# Patient Record
Sex: Male | Born: 1970 | Race: Black or African American | Hispanic: No | Marital: Single | Smoking: Former smoker
Health system: Southern US, Community
[De-identification: ages and names within clinical notes are randomized; demographics above are authoritative.]

## PROBLEM LIST (undated history)

## (undated) DIAGNOSIS — K222 Esophageal obstruction: Secondary | ICD-10-CM

## (undated) DIAGNOSIS — Q828 Other specified congenital malformations of skin: Secondary | ICD-10-CM

## (undated) DIAGNOSIS — T884XXA Failed or difficult intubation, initial encounter: Secondary | ICD-10-CM

## (undated) DIAGNOSIS — Z87442 Personal history of urinary calculi: Secondary | ICD-10-CM

## (undated) DIAGNOSIS — T8859XA Other complications of anesthesia, initial encounter: Secondary | ICD-10-CM

## (undated) DIAGNOSIS — T4145XA Adverse effect of unspecified anesthetic, initial encounter: Secondary | ICD-10-CM

## (undated) HISTORY — DX: Personal history of urinary calculi: Z87.442

## (undated) HISTORY — PX: WISDOM TOOTH EXTRACTION: SHX21

## (undated) HISTORY — DX: Other specified congenital malformations of skin: Q82.8

---

## 2008-01-16 ENCOUNTER — Emergency Department: Payer: Self-pay | Admitting: Emergency Medicine

## 2008-01-25 ENCOUNTER — Ambulatory Visit: Payer: Self-pay | Admitting: Urology

## 2008-07-02 IMAGING — CR DG ABDOMEN 1V
1 series · 1 of 1 positions shown · non-contrast
Comparison: none

REASON FOR EXAM: Renal calculi-lithotripsy
COMMENTS:

[view not recorded]
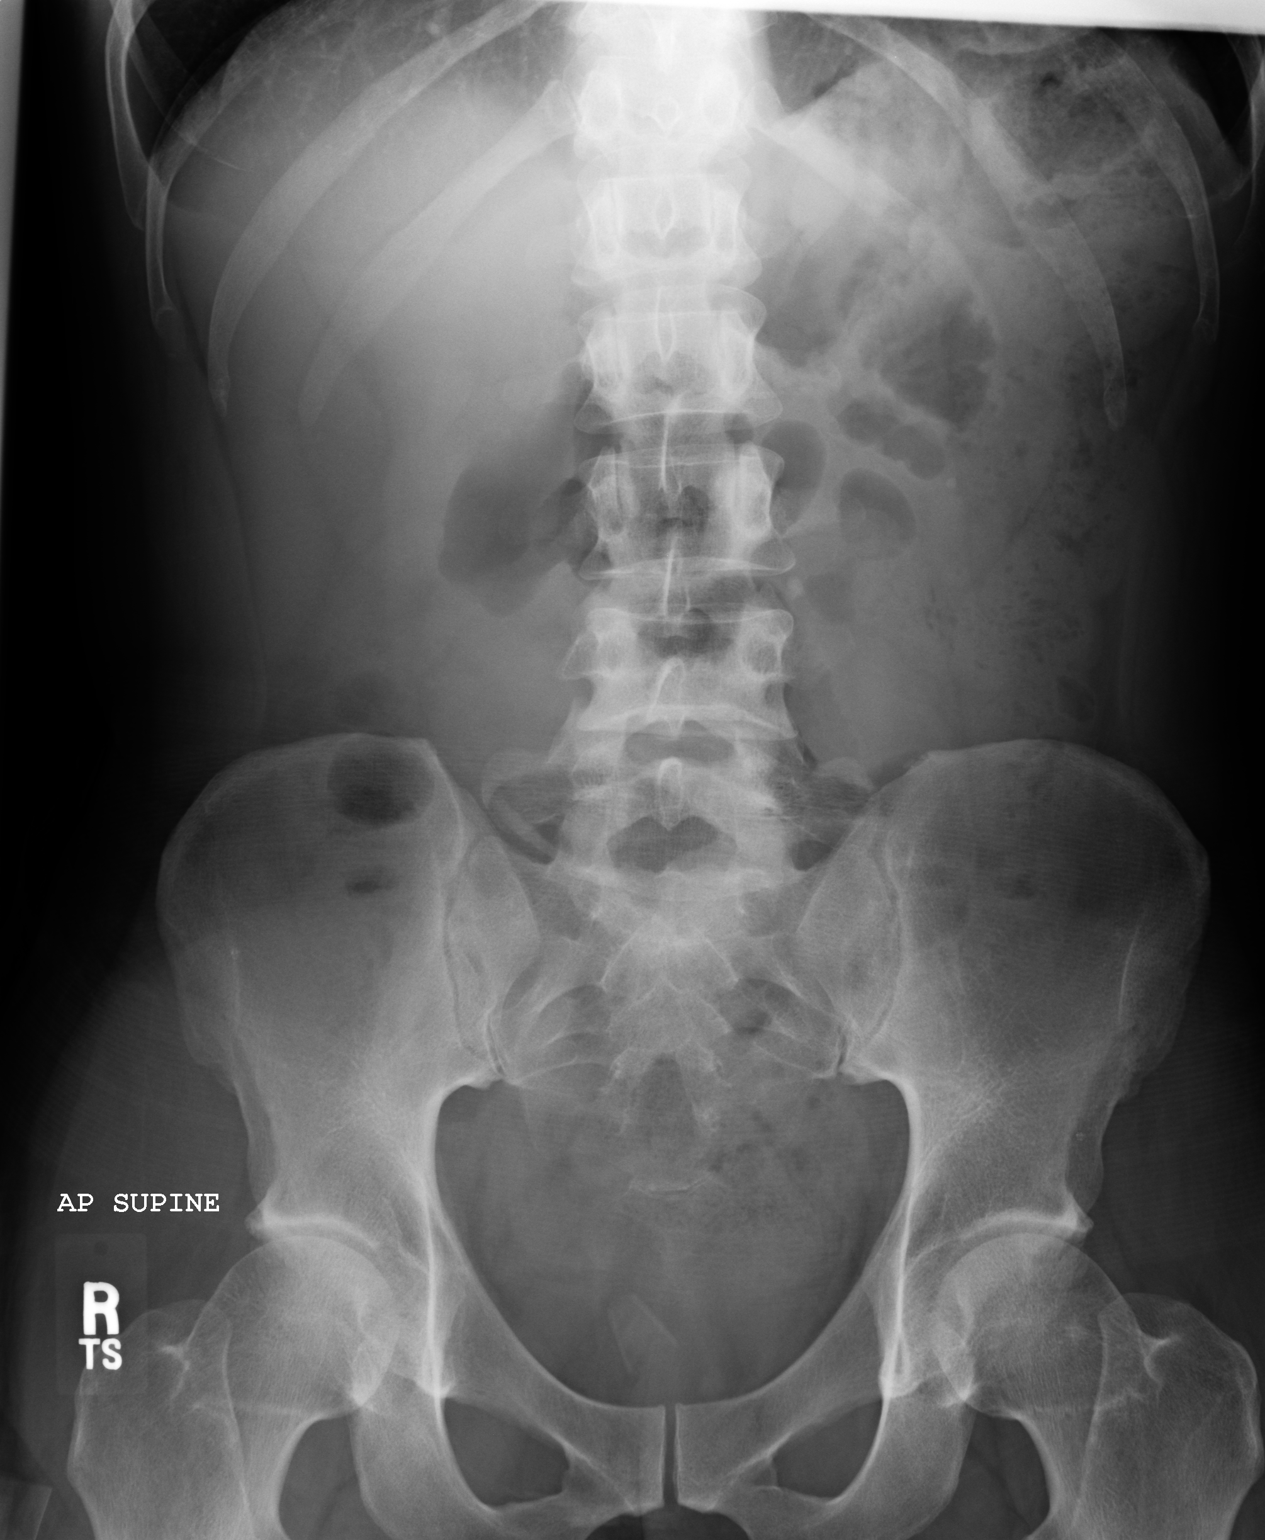

[1 of 1 positions shown; findings below may reference images not displayed]

PROCEDURE:     DXR - DXR KIDNEY URETER BLADDER  - January 25, 2008  [DATE]

RESULT:     There is 5.0 mm calcification projected just lateral to the L3-4
lumbar intervertebral disc space which likely represents the LEFT ureteral
stone noted at prior CT. Also noted is a tiny density projected over the
lower pole of the LEFT kidney compatible with a LEFT renal stone. No renal
or ureteral calcifications are identified on RIGHT.
IMPRESSION: Please see above.

## 2014-05-05 ENCOUNTER — Emergency Department: Payer: Self-pay | Admitting: Emergency Medicine

## 2014-06-05 LAB — LIPID PANEL
CHOLESTEROL: 157 mg/dL (ref 0–200)
HDL: 34 mg/dL — AB (ref 35–70)
LDL Cholesterol: 103 mg/dL
Triglycerides: 99 mg/dL (ref 40–160)

## 2015-06-09 ENCOUNTER — Encounter: Payer: Self-pay | Admitting: Family Medicine

## 2015-06-09 ENCOUNTER — Ambulatory Visit (INDEPENDENT_AMBULATORY_CARE_PROVIDER_SITE_OTHER): Payer: BLUE CROSS/BLUE SHIELD | Admitting: Family Medicine

## 2015-06-09 ENCOUNTER — Encounter (INDEPENDENT_AMBULATORY_CARE_PROVIDER_SITE_OTHER): Payer: Self-pay

## 2015-06-09 VITALS — BP 122/80 | HR 87 | Temp 98.3°F | Resp 18 | Ht 67.0 in | Wt 160.1 lb

## 2015-06-09 DIAGNOSIS — Z87442 Personal history of urinary calculi: Secondary | ICD-10-CM | POA: Insufficient documentation

## 2015-06-09 DIAGNOSIS — R7989 Other specified abnormal findings of blood chemistry: Secondary | ICD-10-CM | POA: Diagnosis not present

## 2015-06-09 DIAGNOSIS — Z0000 Encounter for general adult medical examination without abnormal findings: Secondary | ICD-10-CM | POA: Diagnosis not present

## 2015-06-09 DIAGNOSIS — R1310 Dysphagia, unspecified: Secondary | ICD-10-CM

## 2015-06-09 DIAGNOSIS — Z13220 Encounter for screening for lipoid disorders: Secondary | ICD-10-CM | POA: Insufficient documentation

## 2015-06-09 DIAGNOSIS — L0292 Furuncle, unspecified: Secondary | ICD-10-CM | POA: Insufficient documentation

## 2015-06-09 DIAGNOSIS — Q828 Other specified congenital malformations of skin: Secondary | ICD-10-CM | POA: Insufficient documentation

## 2015-06-09 DIAGNOSIS — K200 Eosinophilic esophagitis: Secondary | ICD-10-CM | POA: Insufficient documentation

## 2015-06-09 DIAGNOSIS — Z113 Encounter for screening for infections with a predominantly sexual mode of transmission: Secondary | ICD-10-CM

## 2015-06-09 DIAGNOSIS — Z125 Encounter for screening for malignant neoplasm of prostate: Secondary | ICD-10-CM

## 2015-06-09 NOTE — Patient Instructions (Signed)
Goiter Goiter is an enlarged thyroid gland. The thyroid gland sits at the base of the front of the neck. The gland produces hormones that regulate mood, body temperature, pulse rate, and digestion. Most goiters are painless and are not a cause for serious concern. Goiters and conditions that cause goiters can be treated if necessary.  CAUSES  Common causes of goiter include:  Graves disease (causes too much hormone to be produced [hyperthyroidism]).  Hashimoto disease (causes too little hormone to be produced [hypothyroidism]).  Thyroiditis (inflammation of the thyroid sometimes caused by virus or pregnancy).  Nodular goiter (small bumps form; sometimes called toxic nodular goiter).  Pregnancy.  Thyroid cancer (very few goiters with nodules are cancerous).  Certain medications.  Radiation exposure.  Iodine deficiency (more common in developing countries in inland populations). RISK FACTORS Risk factors for goiter include:  A family history of goiter.  Male gender.  Inadequate iodine in the diet.  Age older than 40 years. SYMPTOMS  Many goiters do not cause symptoms. When symptoms do occur, they may include:  Swelling in the lower part of the neck. This swelling can range from a very small bump to a large lump.  A tight feeling in the throat.  A hoarse voice. Less commonly, a goiter may result in:  Coughing.  Wheezing.  Difficulty swallowing.  Difficulty breathing.  Bulging neck veins.  Dizziness. When a goiter is the result of hyperthyroidism, symptoms may include:  Rapid or irregular heartbeat.  Sickness in your stomach (nausea).  Vomiting.  Diarrhea.  Shaking.  Irritable feeling.  Bulging eyes.  Weight loss.  Heat sensitivity.  Anxiety. When a goiter is the result of hypothyroidism, symptoms may include:  Tiredness.  Dry skin.  Constipation.  Weight gain.  Irregular menstrual cycle.  Depressed mood.  Sensitivity to  cold. DIAGNOSIS  Tests used to diagnose goiter include:  A physical exam.  Blood tests, including thyroid hormone levels and antibody testing.  Ultrasonography, computerized X-ray scan (computed tomography, CT) or computerized magnetic scan (magnetic resonance imaging, MRI).  Thyroid scan (imaging along with safe radioactive injection).  Tissue sample taken (biopsy) of nodules. This is sometimes done to confirm that the nodules are not cancerous. TREATMENT  Treatment will depend on the cause of the goiter. Treatment may include:  Monitoring. In some cases, no treatment is necessary, and your doctor will monitor your condition at regular checkups.  Medications and supplements. Thyroid medication (thyroid hormone replacement) is available for hyperthyroidism and hypothyroidism.  If inflammation is the cause, over-the-counter medication or steroid medication may be recommended.  Goiters caused by iodine deficiency can be treated with iodine supplements or changes in diet.  Radioactive iodine treatment. Radioactive iodine is injected into the blood. It travels to the thyroid gland, kills thyroid cells, and reduces the size of the gland. This is only used when the thyroid gland is overactive. Lifelong thyroid hormone medication is often necessary after this treatment.  Surgery. A procedure to remove all or part of the gland may be recommended in severe cases or when cancer is the cause. Hormones can be taken to replace the hormones normally produced by the thyroid. HOME CARE INSTRUCTIONS   Take medications as directed.  Follow your caregiver's recommendations for any dietary changes.  Follow up with your caregiver for further examination and testing, as directed. PREVENTION   If you have a family history of goiter, discuss screening with your doctor.  Make sure you are getting enough iodine in your diet.  Use   of iodized table salt can help prevent iodine deficiency. Document  Released: 04/21/2010 Document Revised: 03/18/2014 Document Reviewed: 04/21/2010 Exit[employer] Patient Information 2015 [employer]. This information is not intended to replace advice given to you by your health care provider. Make sure you discuss any questions you have with your health care provider.  

## 2015-06-09 NOTE — Progress Notes (Signed)
Name: Rodney Salinas   MRN: [id number]    DOB: 09-11-1971   Date:06/09/2015       Progress Note  Subjective  Chief Complaint  Chief Complaint  Patient presents with  . Annual Exam    patient is here for his annual physical and wants to be tested for any STDs     HPI  Patient is here today for a Complete Male Physical Exam:  The patient has no acute concerns but does want to address swallowing issues. Overall feels healthy. Diet is well balanced. In general does exercise regularly. Sees dentist regularly and addresses vision concerns with ophthalmologist if applicable. In regards to sexual activity the patient is currently sexually active. Currently is concerned about exposure to any STDs. Would like full STD check.  Patient is concerned about some swallowing difficulties he has had since out visit last year. Mostly with solids sticking to back of throat without pain and not with every meal. Occurrence about 4-6 events so far. Drinking liquid along with meals helps avoid these symptoms. No significant unwanted weight loss, reflux, coughing. Does tend to have some seasonal allergy issues.  During last year's routine lab work patient had a suppressed TSH but repeat lab work normalized. He has not symptoms of thyroid disorder (other than occasional swallowing difficulty) such as constipation, hair loss, skin changes, mood changes, memory changes, palpitations, cold or heat intolerance. Positive family history of thyroid disorder in his Father.   Past Medical History  Diagnosis Date  . History of kidney stones   . Cutis verticis gyrata     Past Surgical History  Procedure Laterality Date  . Wisdom tooth extraction      Family History  Problem Relation Age of Onset  . Hypertension Mother   . Arthritis Mother     OA  . Migraines Mother   . Cancer Father     Prostate  . Hyperlipidemia Father     History   Social History  . Marital Status: Single    Spouse Name: N/A  . Number  of Children: 1  . Years of Education: N/A   Occupational History  . customer service coordinator    Social History Main Topics  . Smoking status: Former Games developer  . Smokeless tobacco: Not on file     Comment: stopped smoking cigars about 2006-2007  . Alcohol Use: 0.0 oz/week    0 Standard drinks or equivalent per week     Comment: social  . Drug Use: Yes     Comment: marijuana  . Sexual Activity:    Partners: Female    Birth Control/ Protection: Other-see comments     Comment: sometimes   Other Topics Concern  . Not on file   Social History Narrative    No current outpatient prescriptions on file.  No Known Allergies   Depression screen [facility] 2/9 06/09/2015  Decreased Interest 1  Down, Depressed, Hopeless 0  PHQ - 2 Score 1    ROS  CONSTITUTIONAL: No significant weight changes, fever, chills, weakness or fatigue.  HEENT:  - Eyes: No visual changes.  - Ears: No auditory changes. No pain.  - Nose: No sneezing, congestion, runny nose. - Throat: No sore throat. Yes changes in swallowing. SKIN: No rash or itching.  CARDIOVASCULAR: No chest pain, chest pressure or chest discomfort. No palpitations or edema.  RESPIRATORY: No shortness of breath, cough or sputum.  GASTROINTESTINAL: No anorexia, nausea, vomiting. No changes in bowel habits. No abdominal pain or blood.  GENITOURINARY: No dysuria. No frequency. No discharge.  NEUROLOGICAL: No headache, dizziness, syncope, paralysis, ataxia, numbness or tingling in the extremities. No memory changes. No change in bowel or bladder control.  MUSCULOSKELETAL: No joint pain. No muscle pain. HEMATOLOGIC: No anemia, bleeding or bruising.  LYMPHATICS: No enlarged lymph nodes.  PSYCHIATRIC: No change in mood. No change in sleep pattern.  ENDOCRINOLOGIC: No reports of sweating, cold or heat intolerance. No polyuria or polydipsia.   Objective  Filed Vitals:   06/09/15 1356  BP: 122/80  Pulse: 87  Temp: 98.3 F (36.8 C)  TempSrc:  Oral  Resp: 18  Height: 5\' 7"  (1.702 m)  Weight: 160 lb 1.6 oz (72.621 kg)  SpO2: 96%   Body mass index is 25.07 kg/(m^2).   Physical Exam  Constitutional: Patient appears well-developed and well-nourished. In no distress.  HEENT:  - Head: Normocephalic and atraumatic. Excessive folds of scalp skin at back of head. - Ears: Bilateral TMs gray, no erythema or effusion - Nose: Nasal mucosa moist - Mouth/Throat: Oropharynx is clear and moist. No tonsillar hypertrophy or erythema. No post nasal drainage.  - Eyes: Conjunctivae clear, EOM movements normal. PERRLA. No scleral icterus.  Neck: Normal range of motion. Neck supple. No JVD present. Diffuse smooth thyromegaly present.  Cardiovascular: Normal rate, regular rhythm and normal heart sounds.  No murmur heard.  Pulmonary/Chest: Effort normal and breath sounds normal. No respiratory distress. Abdominal: Soft. Bowel sounds are normal, no distension. There is no tenderness. no masses BREAST: Bilateral breast exam normal with no masses, skin changes or nipple discharge MALE GENITALIA: Bilateral testes descended with no masses, no penile lesions, no penile discharge. PROSTATE: Deffered RECTAL: Deffered Musculoskeletal: Normal range of motion bilateral UE and LE, no joint effusions. Peripheral vascular: Bilateral LE no edema. Neurological: CN II-XII grossly intact with no focal deficits. Alert and oriented to person, place, and time. Coordination, balance, strength, speech and gait are normal.  Skin: Skin is warm and dry. No rash noted. No erythema.  Psychiatric: Patient has a normal mood and affect. Behavior is normal in office today. Judgment and thought content normal in office today.   Assessment & Plan  1. 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 Encounter for screening for lipoid disorders  - Lipid panel  3. Abnormal thyroid stimulating  hormone (TSH) level In 2015 TSH was slightly suppressed and repeat lab work normalized. Today's exam reveals a uniformly enlarged smooth goiter with some occasional swallowing issues. Will repeat blood work and consider referral to ENT vs Endocrinology.  - CBC with Differential/Platelet - Comprehensive metabolic panel - TSH - T3, free - T4, free - Thyroglobulin antibody - Thyroid peroxidase antibody - Thyroid stimulating immunoglobulin  4. Encounter for screening for malignant neoplasm of prostate Patient preferred to defer exam this year.  - PSA  5. Screening for STD (sexually transmitted disease)  - Hepatitis A antibody, total - Hepatitis B surface antibody - Hepatitis C antibody - HIV antibody - HSV 2 antibody, IgG - RPR - GC/chlamydia probe amp, urine  6. Swallowing dysfunction Etiologies discussed: Goiter, esophageal web, esophageal outpouching. If thyroid relation ruled out will send to GI for EGD and swallow studies near future.

## 2015-06-10 ENCOUNTER — Other Ambulatory Visit: Payer: Self-pay | Admitting: Family Medicine

## 2015-06-12 LAB — CHLAMYDIA/GONOCOCCUS/TRICHOMONAS, NAA: Trich vag by NAA: NEGATIVE

## 2015-07-11 ENCOUNTER — Encounter: Payer: Self-pay | Admitting: Family Medicine

## 2015-07-11 DIAGNOSIS — R17 Unspecified jaundice: Secondary | ICD-10-CM | POA: Insufficient documentation

## 2015-07-11 LAB — COMPREHENSIVE METABOLIC PANEL
ALT: 20 IU/L (ref 0–44)
AST: 23 IU/L (ref 0–40)
Albumin/Globulin Ratio: 2 (ref 1.1–2.5)
Albumin: 5.1 g/dL (ref 3.5–5.5)
Alkaline Phosphatase: 82 IU/L (ref 39–117)
BUN / CREAT RATIO: 13 (ref 9–20)
BUN: 13 mg/dL (ref 6–24)
Bilirubin Total: 1.9 mg/dL — ABNORMAL HIGH (ref 0.0–1.2)
CALCIUM: 9.5 mg/dL (ref 8.7–10.2)
CO2: 24 mmol/L (ref 18–29)
Chloride: 100 mmol/L (ref 97–108)
Creatinine, Ser: 1 mg/dL (ref 0.76–1.27)
GFR, EST AFRICAN AMERICAN: 105 mL/min/{1.73_m2} (ref >59)
GFR, EST NON AFRICAN AMERICAN: 91 mL/min/{1.73_m2} (ref >59)
GLOBULIN, TOTAL: 2.5 g/dL (ref 1.5–4.5)
Glucose: 85 mg/dL (ref 65–99)
Potassium: 4 mmol/L (ref 3.5–5.2)
Sodium: 141 mmol/L (ref 134–144)
Total Protein: 7.6 g/dL (ref 6.0–8.5)

## 2015-07-11 LAB — T4, FREE: FREE T4: 1.34 ng/dL (ref 0.82–1.77)

## 2015-07-11 LAB — CBC WITH DIFFERENTIAL/PLATELET
BASOS: 0 %
Basophils Absolute: 0 10*3/uL (ref 0.0–0.2)
EOS (ABSOLUTE): 0.2 10*3/uL (ref 0.0–0.4)
EOS: 2 %
HEMATOCRIT: 42.5 % (ref 37.5–51.0)
Hemoglobin: 14.8 g/dL (ref 12.6–17.7)
IMMATURE GRANULOCYTES: 0 %
Immature Grans (Abs): 0 10*3/uL (ref 0.0–0.1)
Lymphocytes Absolute: 1.7 10*3/uL (ref 0.7–3.1)
Lymphs: 18 %
MCH: 31.9 pg (ref 26.6–33.0)
MCHC: 34.8 g/dL (ref 31.5–35.7)
MCV: 92 fL (ref 79–97)
MONOS ABS: 0.8 10*3/uL (ref 0.1–0.9)
Monocytes: 8 %
NEUTROS PCT: 72 %
Neutrophils Absolute: 6.8 10*3/uL (ref 1.4–7.0)
Platelets: 227 10*3/uL (ref 150–379)
RBC: 4.64 x10E6/uL (ref 4.14–5.80)
RDW: 13 % (ref 12.3–15.4)
WBC: 9.5 10*3/uL (ref 3.4–10.8)

## 2015-07-11 LAB — PSA: Prostate Specific Ag, Serum: 0.6 ng/mL (ref 0.0–4.0)

## 2015-07-11 LAB — LIPID PANEL
CHOL/HDL RATIO: 3.5 ratio (ref 0.0–5.0)
Cholesterol, Total: 167 mg/dL (ref 100–199)
HDL: 48 mg/dL (ref >39)
LDL Calculated: 108 mg/dL — ABNORMAL HIGH (ref 0–99)
Triglycerides: 57 mg/dL (ref 0–149)
VLDL Cholesterol Cal: 11 mg/dL (ref 5–40)

## 2015-07-11 LAB — THYROGLOBULIN ANTIBODY: Thyroglobulin Antibody: <1 IU/mL (ref 0.0–0.9)

## 2015-07-11 LAB — HEPATITIS C ANTIBODY

## 2015-07-11 LAB — RPR: RPR: NONREACTIVE

## 2015-07-11 LAB — THYROID PEROXIDASE ANTIBODY: Thyroperoxidase Ab SerPl-aCnc: <6 IU/mL (ref 0–34)

## 2015-07-11 LAB — HEPATITIS B SURFACE ANTIBODY,QUALITATIVE: Hep B Surface Ab, Qual: NONREACTIVE

## 2015-07-11 LAB — TSH: TSH: 0.602 u[IU]/mL (ref 0.450–4.500)

## 2015-07-11 LAB — HSV 2 ANTIBODY, IGG: HSV 2 Glycoprotein G Ab, IgG: <0.91 index (ref 0.00–0.90)

## 2015-07-11 LAB — T3, FREE: T3 FREE: 3.6 pg/mL (ref 2.0–4.4)

## 2015-07-11 LAB — HEPATITIS A ANTIBODY, TOTAL: Hep A Total Ab: NEGATIVE

## 2015-07-11 LAB — HIV ANTIBODY (ROUTINE TESTING W REFLEX): HIV Screen 4th Generation wRfx: NONREACTIVE

## 2015-07-20 LAB — THYROID STIMULATING IMMUNOGLOBULIN: TSI: 27 % (ref 0–139)

## 2015-11-30 ENCOUNTER — Ambulatory Visit
Admission: EM | Admit: 2015-11-30 | Discharge: 2015-11-30 | DRG: 392 | Disposition: A | Discharge status: Home / Self Care | Payer: BLUE CROSS/BLUE SHIELD | Attending: Unknown Physician Specialty | Admitting: Unknown Physician Specialty

## 2015-11-30 ENCOUNTER — Emergency Department: Payer: BLUE CROSS/BLUE SHIELD | Admitting: Certified Registered Nurse Anesthetist

## 2015-11-30 ENCOUNTER — Emergency Department: Payer: BLUE CROSS/BLUE SHIELD

## 2015-11-30 ENCOUNTER — Encounter
Admission: EM | Disposition: A | Discharge status: Home / Self Care | Payer: Self-pay | Source: Home / Self Care | Attending: Emergency Medicine

## 2015-11-30 ENCOUNTER — Encounter: Payer: Self-pay | Admitting: Emergency Medicine

## 2015-11-30 DIAGNOSIS — Z87442 Personal history of urinary calculi: Secondary | ICD-10-CM | POA: Insufficient documentation

## 2015-11-30 DIAGNOSIS — Z8042 Family history of malignant neoplasm of prostate: Secondary | ICD-10-CM | POA: Insufficient documentation

## 2015-11-30 DIAGNOSIS — X58XXXA Exposure to other specified factors, initial encounter: Secondary | ICD-10-CM | POA: Diagnosis not present

## 2015-11-30 DIAGNOSIS — Z87891 Personal history of nicotine dependence: Secondary | ICD-10-CM | POA: Diagnosis not present

## 2015-11-30 DIAGNOSIS — Z820 Family history of epilepsy and other diseases of the nervous system: Secondary | ICD-10-CM | POA: Insufficient documentation

## 2015-11-30 DIAGNOSIS — K210 Gastro-esophageal reflux disease with esophagitis: Secondary | ICD-10-CM | POA: Insufficient documentation

## 2015-11-30 DIAGNOSIS — Z8249 Family history of ischemic heart disease and other diseases of the circulatory system: Secondary | ICD-10-CM | POA: Diagnosis not present

## 2015-11-30 DIAGNOSIS — K222 Esophageal obstruction: Secondary | ICD-10-CM

## 2015-11-30 DIAGNOSIS — T18128A Food in esophagus causing other injury, initial encounter: Secondary | ICD-10-CM | POA: Diagnosis present

## 2015-11-30 HISTORY — PX: FOREIGN BODY REMOVAL: SHX962

## 2015-11-30 LAB — BASIC METABOLIC PANEL
ANION GAP: 7 (ref 5–15)
BUN: 11 mg/dL (ref 6–20)
CALCIUM: 9.6 mg/dL (ref 8.9–10.3)
CO2: 29 mmol/L (ref 22–32)
Chloride: 108 mmol/L (ref 101–111)
Creatinine, Ser: 0.95 mg/dL (ref 0.61–1.24)
GFR calc Af Amer: >60 mL/min (ref >60)
Glucose, Bld: 105 mg/dL — ABNORMAL HIGH (ref 65–99)
POTASSIUM: 3.9 mmol/L (ref 3.5–5.1)
SODIUM: 144 mmol/L (ref 135–145)

## 2015-11-30 LAB — CBC
HEMATOCRIT: 47.7 % (ref 40.0–52.0)
HEMOGLOBIN: 16.3 g/dL (ref 13.0–18.0)
MCH: 32.6 pg (ref 26.0–34.0)
MCHC: 34.1 g/dL (ref 32.0–36.0)
MCV: 95.6 fL (ref 80.0–100.0)
Platelets: 203 10*3/uL (ref 150–440)
RBC: 4.98 MIL/uL (ref 4.40–5.90)
RDW: 12.6 % (ref 11.5–14.5)
WBC: 9.3 10*3/uL (ref 3.8–10.6)

## 2015-11-30 SURGERY — REMOVAL, FOREIGN BODY
Anesthesia: General

## 2015-11-30 MED ORDER — LACTATED RINGERS IV SOLN
INTRAVENOUS | Status: DC | PRN
  Administered 2015-11-30: 19:00:00 via INTRAVENOUS

## 2015-11-30 MED ORDER — FENTANYL CITRATE (PF) 100 MCG/2ML IJ SOLN
INTRAMUSCULAR | Status: DC | PRN
  Administered 2015-11-30: 50 ug via INTRAVENOUS

## 2015-11-30 MED ORDER — SUCCINYLCHOLINE CHLORIDE 20 MG/ML IJ SOLN
INTRAMUSCULAR | Status: DC | PRN
  Administered 2015-11-30: 100 mg via INTRAVENOUS
  Administered 2015-11-30: 40 mg via INTRAVENOUS

## 2015-11-30 MED ORDER — ONDANSETRON HCL 4 MG/2ML IJ SOLN: 4.0000 mg | Freq: Once | INTRAMUSCULAR | Status: DC | PRN

## 2015-11-30 MED ORDER — PROPOFOL 10 MG/ML IV BOLUS
INTRAVENOUS | Status: DC | PRN
  Administered 2015-11-30: 40 mg via INTRAVENOUS
  Administered 2015-11-30: 200 mg via INTRAVENOUS

## 2015-11-30 MED ORDER — MIDAZOLAM HCL 5 MG/5ML IJ SOLN
INTRAMUSCULAR | Status: DC | PRN
  Administered 2015-11-30: 1 mg via INTRAVENOUS

## 2015-11-30 MED ORDER — FENTANYL CITRATE (PF) 100 MCG/2ML IJ SOLN: 25.0000 ug | INTRAMUSCULAR | Status: DC | PRN

## 2015-11-30 NOTE — Transfer of Care (Signed)
Immediate Anesthesia Transfer of Care Note  Patient: Rodney Salinas  Procedure(s) Performed: Procedure(s): FOREIGN BODY REMOVAL (N/A)  Patient Location: PACU  Anesthesia Type:General  Level of Consciousness: awake and oriented  Airway & Oxygen Therapy: Patient Spontanous Breathing and Patient connected to face mask oxygen  Post-op Assessment: Report given to RN and Post -op Vital signs reviewed and stable  Post vital signs: Reviewed and stable  Last Vitals:  Filed Vitals:   11/30/15 1929 11/30/15 1932  BP: 130/77   Pulse: 76   Temp: 36.4 C 36.4 C  Resp: 13     Complications: No apparent anesthesia complications

## 2015-11-30 NOTE — H&P (Signed)
   Primary Care Physician:  Edwena FeltyAshany Sundaram, MD Primary Gastroenterologist:  Dr. Mechele CollinElliott  Pre-Procedure History & Physical: HPI:  Rodney Salinas is a 45 y.o. male is here for an endoscopy.  He has food stuck in his esophagus since last night.  He has a hx of recurrent hanging up of food bolus but never stuck like this.  He was given carbonated beverages and it did not help it move anywhere. He will need EGD while intubated to remove the foood/   Past Medical History  Diagnosis Date  . History of kidney stones   . Cutis verticis gyrata     Past Surgical History  Procedure Laterality Date  . Wisdom tooth extraction      Prior to Admission medications   Medication Sig Start Date End Date Taking? Authorizing Provider  Soft Lens Products (OPTI-FREE FOR DISINFECTION) 0.1 % SOLN 1 drop by Does not apply route daily as needed (for contacts.).   Yes Historical Provider, MD    Allergies as of 11/30/2015  . (No Known Allergies)    Family History  Problem Relation Age of Onset  . Hypertension Mother   . Arthritis Mother     OA  . Migraines Mother   . Cancer Father     Prostate  . Hyperlipidemia Father     Social History   Social History  . Marital Status: Single    Spouse Name: N/A  . Number of Children: 1  . Years of Education: N/A   Occupational History  . customer service coordinator    Social History Main Topics  . Smoking status: Former Games developermoker  . Smokeless tobacco: Not on file     Comment: stopped smoking cigars about 2006-2007  . Alcohol Use: 0.0 oz/week    0 Standard drinks or equivalent per week     Comment: social  . Drug Use: Yes     Comment: marijuana  . Sexual Activity:    Partners: Female    Birth Control/ Protection: Other-see comments     Comment: sometimes   Other Topics Concern  . Not on file   Social History Narrative    Review of Systems: See HPI, otherwise negative ROS  Physical Exam: BP 141/84 mmHg  Pulse 72  Temp(Src) 98.9 F (37.2  C) (Oral)  Resp 18  Ht 5\' 8"  (1.727 m)  Wt 68.04 kg (150 lb)  BMI 22.81 kg/m2  SpO2 96% General:   Alert,  pleasant and cooperative in NAD Head:  Normocephalic and atraumatic. Neck:  Supple; no masses or thyromegaly. Lungs:  Clear throughout to auscultation.    Heart:  Regular rate and rhythm. Abdomen:  Soft, nontender and nondistended. Normal bowel sounds, without guarding, and without rebound.   Neurologic:  Alert and  oriented x4;  grossly normal neurologically.  Impression/Plan: Rodney Salinas is here for an endoscopy to be performed for removal of foreign body in the esophagus.  Risks, benefits, limitations, and alternatives regarding  endoscopy have been reviewed with the patient.  Questions have been answered.  All parties agreeable.   [name], [name], MD  11/30/2015, 5:59 PM

## 2015-11-30 NOTE — ED Provider Notes (Addendum)
[facility] Emergency Department Provider Note  ____________________________________________  Time seen: Approximately 4:42 PM  I have reviewed the triage vital signs and the nursing notes.   HISTORY  Chief Complaint Dysphagia    HPI Rodney Salinas is a 45 y.o. male patient reports solids and liquids been getting stuck in the bottom of his neck. This happened last night and continuing this morning they tries to drink even water just comes right back up again. Before that it happened on Wednesday last week it's happened several times since even before that. He reports is happening more frequently now than it had been. It is no pain.   Past Medical History  Diagnosis Date  . History of kidney stones   . Cutis verticis gyrata     Patient Active Problem List   Diagnosis Date Noted  . Elevated bilirubin 07/11/2015  . Cutis verticis gyrata 06/09/2015  . 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 H/O renal calculi 06/09/2015  . Encounter for screening for malignant neoplasm of prostate 06/09/2015  . Encounter for screening for lipoid disorders 06/09/2015  . Abnormal thyroid stimulating hormone (TSH) level 06/09/2015  . Annual physical exam 06/09/2015  . Screening for STD (sexually transmitted disease) 06/09/2015  . Swallowing dysfunction 06/09/2015    Past Surgical History  Procedure Laterality Date  . Wisdom tooth extraction      Current Outpatient Rx  Name  Route  Sig  Dispense  Refill  . Soft Lens Products (OPTI-FREE FOR DISINFECTION) 0.1 % SOLN   Does not apply   1 drop by Does not apply route daily as needed (for contacts.).           Allergies Review of patient's allergies indicates no known allergies.  Family History  Problem Relation Age of Onset  . Hypertension Mother   . Arthritis Mother     OA  . Migraines Mother   . Cancer Father     Prostate  . Hyperlipidemia Father     Social History Social History  Substance Use Topics  . Smoking  status: Former Games developer  . Smokeless tobacco: None     Comment: stopped smoking cigars about 2006-2007  . Alcohol Use: 0.0 oz/week    0 Standard drinks or equivalent per week     Comment: social    Review of Systems Constitutional: No fever/chills Eyes: No visual changes. ENT: No sore throat. Cardiovascular: Denies chest pain. Respiratory: Denies shortness of breath. Gastrointestinal: No abdominal pain.  No nausea, no vomiting.  No diarrhea.  No constipation. Genitourinary: Negative for dysuria. Musculoskeletal: Negative for back pain. Skin: Negative for rash. Neurological: Negative for headaches, focal weakness or numbness.  10-point ROS otherwise negative.  ____________________________________________   PHYSICAL EXAM:  VITAL SIGNS: ED Triage Vitals  Enc Vitals Group     BP 11/30/15 1404 141/84 mmHg     Pulse Rate 11/30/15 1404 72     Resp 11/30/15 1404 18     Temp 11/30/15 1404 98.9 F (37.2 C)     Temp Source 11/30/15 1404 Oral     SpO2 11/30/15 1404 96 %     Weight 11/30/15 1404 150 lb (68.04 kg)     Height 11/30/15 1404 5\' 8"  (1.727 m)     Head Cir --      Peak Flow --      Pain Score 11/30/15 1407 8     Pain Loc --      Pain Edu? --      Excl. in  GC? --    Constitutional: Alert and oriented. Well appearing and in no acute distress. Eyes: Conjunctivae are normal. PERRL. EOMI. Head: Atraumatic. Nose: No congestion/rhinnorhea. Mouth/Throat: Mucous membranes are moist.  Oropharynx non-erythematous. Neck: No stridor.  Cardiovascular: Normal rate, regular rhythm. Grossly normal heart sounds.  Good peripheral circulation. Respiratory: Normal respiratory effort.  No retractions. Lungs CTAB. Gastrointestinal: Soft and nontender. No distention. No abdominal bruits. No CVA tenderness. Musculoskeletal: No lower extremity tenderness nor edema.  No joint effusions. Neurologic:  Normal speech and language. No gross focal neurologic deficits are appreciated. No gait  instability. Skin:  Skin is warm, dry and intact. No rash noted. Psychiatric: Mood and affect are normal. Speech and behavior are normal.  ____________________________________________   LABS (all labs ordered are listed, but only abnormal results are displayed)  Labs Reviewed  BASIC METABOLIC PANEL - Abnormal; Notable for the following:    Glucose, Bld 105 (*)    All other components within normal limits  CBC   ____________________________________________  EKG  EKG read and interpreted by me shows normal sinus rhythm rate of 68 normal axis no acute ST-T wave changes there is diffuse ST elevation most likely early repolarization ____________________________________________  RADIOLOGY  Chest x-ray read and interpreted by me shows no acute disease ____________________________________________   PROCEDURES  Patient attempted to drink carbonated beverage in this case chest is bright twice. Both times vomited the material up almost immediately afterwards. Dr. Mechele CollinElliott is coming to see the patient.  ____________________________________________   INITIAL IMPRESSION / ASSESSMENT AND PLAN / ED COURSE  Pertinent labs & imaging results that were available during my care of the patient were reviewed by me and considered in my medical decision making (see chart for details).  ____________________________________________   FINAL CLINICAL IMPRESSION(S) / ED DIAGNOSES  Final diagnoses:  Esophageal obstruction due to food impaction      Arnaldo NatalPaul F Soren Lazarz, MD 11/30/15 1716  Arnaldo NatalPaul F Jerime Arif, MD 12/03/15 [phone]

## 2015-11-30 NOTE — Anesthesia Procedure Notes (Signed)
Procedure Name: Intubation Performed by: Derinda LateIACONE, Ashby Leflore Pre-anesthesia Checklist: Patient identified, Emergency Drugs available, Suction available, Patient being monitored and Timeout performed Patient Re-evaluated:Patient Re-evaluated prior to inductionOxygen Delivery Method: Circle system utilized Preoxygenation: Pre-oxygenation with 100% oxygen Intubation Type: IV induction, Rapid sequence and Cricoid Pressure applied Grade View: Grade IV Tube type: Oral Laser Tube: Cuffed inflated with minimal occlusive pressure - saline Tube size: 7.5 mm Number of attempts: 3 Airway Equipment and Method: Patient positioned with wedge pillow,  Stylet,  Bougie stylet and Video-laryngoscopy Placement Confirmation: ETT inserted through vocal cords under direct vision,  positive ETCO2 and breath sounds checked- equal and bilateral Secured at: 22 cm Tube secured with: Tape Dental Injury: Teeth and Oropharynx as per pre-operative assessment  Difficulty Due To: Difficulty was unanticipated and Difficult Airway- due to anterior larynx Future Recommendations: Recommend- induction with short-acting agent, and alternative techniques readily available Comments: Two passes with mac then miller blade with poor view and unable to intubate.  Repeat with glide scope yields good view and easy pass yielding +etc02 and bs=b.

## 2015-11-30 NOTE — Discharge Instructions (Signed)

## 2015-11-30 NOTE — ED Notes (Signed)
Patient presents to the ED stating, "I think I'm having trouble with my esophagus."  Patient reports history of difficulty swallowing in the past but never seeing a GI doctor.  Patient states starting last night patient is having difficulty swallowing anything including fluids.  Patient reports frequent burping and states when he tries to swallow anything it feels like, "it's stuck in my throat."  Patient is in no obvious distress at this time.

## 2015-11-30 NOTE — ED Notes (Signed)
Patient states that he feels like his esophagus is closing up and he is having trouble eating and drinking. States that this started last PM. Has had this happen in the past but it usually resolves on its own. Patient has not been seen by a specialist for this problem and has not tried any medications.  Patient is in NAD at this time, respiration equal and unlabored.

## 2015-11-30 NOTE — Anesthesia Preprocedure Evaluation (Signed)
Anesthesia Evaluation  Patient identified by MRN, date of birth, ID band Patient awake    Reviewed: Allergy & Precautions, H&P , NPO status , Patient's Chart, lab work & pertinent test results, reviewed documented beta blocker date and time   Airway Mallampati: II  TM Distance: >3 FB Neck ROM: full    Dental  (+) Teeth Intact   Pulmonary neg pulmonary ROS, Current Smoker, former smoker,    Pulmonary exam normal        Cardiovascular negative cardio ROS Normal cardiovascular exam Rhythm:regular Rate:Normal     Neuro/Psych negative neurological ROS  negative psych ROS   GI/Hepatic negative GI ROS, Neg liver ROS,   Endo/Other  negative endocrine ROS  Renal/GU negative Renal ROS  negative genitourinary   Musculoskeletal   Abdominal   Peds  Hematology negative hematology ROS (+)   Anesthesia Other Findings   Reproductive/Obstetrics negative OB ROS                             Anesthesia Physical Anesthesia Plan  ASA: II and emergent  Anesthesia Plan: General ETT   Post-op Pain Management:    Induction:   Airway Management Planned:   Additional Equipment:   Intra-op Plan:   Post-operative Plan:   Informed Consent: I have reviewed the patients History and Physical, chart, labs and discussed the procedure including the risks, benefits and alternatives for the proposed anesthesia with the patient or authorized representative who has indicated his/her understanding and acceptance.     Plan Discussed with: CRNA  Anesthesia Plan Comments:         Anesthesia Quick Evaluation  

## 2015-12-01 NOTE — Anesthesia Postprocedure Evaluation (Signed)
Anesthesia Post Note  Patient: Rodney SimmerFrederick Salinas  Procedure(s) Performed: Procedure(s) (LRB): FOREIGN BODY REMOVAL (N/A)  Patient location during evaluation: PACU Anesthesia Type: General Level of consciousness: awake and alert Pain management: pain level controlled Vital Signs Assessment: post-procedure vital signs reviewed and stable Respiratory status: spontaneous breathing, nonlabored ventilation, respiratory function stable and patient connected to nasal cannula oxygen Cardiovascular status: blood pressure returned to baseline and stable Postop Assessment: no signs of nausea or vomiting Anesthetic complications: no    Last Vitals:  Filed Vitals:   11/30/15 1944 11/30/15 1959  BP: 131/79 124/86  Pulse: 79 78  Temp:  36.7 C  Resp: 14     Last Pain:  Filed Vitals:   11/30/15 2005  PainSc: 0-No pain                 Yevette EdwardsJames G Adams

## 2015-12-03 ENCOUNTER — Encounter: Payer: Self-pay | Admitting: Family Medicine

## 2015-12-03 ENCOUNTER — Encounter: Payer: Self-pay | Admitting: Unknown Physician Specialty

## 2015-12-03 LAB — SURGICAL PATHOLOGY

## 2015-12-03 NOTE — Op Note (Addendum)
[facility] Gastroenterology Patient Name: Rodney Salinas Procedure Date: 11/30/2015 6:42 PM MRN: [id number] Date of Birth: Aug 23, 1971 Admit Type: Outpatient Age: 45 Room: [facility] ENDO ROOM 4 Note Status: Supervisor Override Procedure:         Upper GI endoscopy Indications:       Foreign body in the esophagus Providers:         Scot Jun, MD Referring MD:      No Local Md, MD (Referring MD) Medicines:         General Anesthesia Complications:     No immediate complications. Procedure:         Pre-Anesthesia Assessment:                    - After reviewing the risks and benefits, the patient was                     deemed in satisfactory condition to undergo the procedure.                    After obtaining informed consent, the endoscope was passed                     under direct vision. Throughout the procedure, the                     patient's blood pressure, pulse, and oxygen saturations                     were monitored continuously. The Endoscope was introduced                     through the mouth, and advanced to the second potion of                     the duodenum.. The upper GI endoscopy was accomplished                     without difficulty. The patient tolerated the procedure                     well. Findings:      Esophagitis with no active bleeding was found 39 cm from the incisors.       Small amt of blood on esophageal mucosa at gastroesophageal junction and       an esophageal ring at the junction. Food passed into the stomach with       the passage of the scope and putting air into the esophgus.      The stomach was normal.      Esophagus showed minimal feline type mucosa and biopsies done of non       inflammed mucosa for possible eosinophilic esophagitis. Done at 20 and       30cm.      The bulb and 2nd part of the duodenum was normal. Impression:        - Reflux esophagitis.                    - Normal stomach.       - No specimens collected. Recommendation:    - soft food for 3 days, eat slowly, chew well, take small                     bites. Take Omeprazole   a day for 2 monhs. Call office                     tomorrow at [phone] to make follow up appointment to                     see me or my nurse practioner Scot Jun, MD 11/30/2015 7:30:31 PM This report has been signed electronically. Number of Addenda: 0 Note Initiated On: 11/30/2015 6:42 PM      [facility]

## 2015-12-03 NOTE — Op Note (Signed)
[facility] Patient Name: Rodney Salinas Procedure Date: 11/30/2015 6:42 PM MRN: [id number] Account #: [id number] Date of Birth: 1971/03/09 Admit Type: Outpatient Age: 45 Room: [facility] ENDO ROOM 4 Note Status: Finalized Procedure:         Upper GI endoscopy Indications:       Foreign body in the esophagus Providers:         Scot Jun, MD Referring MD:      No Local Md, MD (Referring MD) Medicines:         General Anesthesia Complications:     No immediate complications. Procedure:         Pre-Anesthesia Assessment:                    - After reviewing the risks and benefits, the patient was                     deemed in satisfactory condition to undergo the procedure.                    After obtaining informed consent, the endoscope was passed                     under direct vision. Throughout the procedure, the                     patient's blood pressure, pulse, and oxygen saturations                     were monitored continuously. The Endoscope was introduced                     through the mouth, and advanced to the second potion of                     the duodenum.. The upper GI endoscopy was accomplished                     without difficulty. The patient tolerated the procedure                     well. Findings:      Esophagitis with no active bleeding was found 39 cm from the incisors.       Small amt of blood on esophageal mucosa at gastroesophageal junction and       an esophageal ring at the junction. Food passed into the stomach with       the passage of the scope and putting air into the esophgus.      The stomach was normal.      Esophagus showed minimal feline type mucosa and biopsies done of non       inflammed mucosa for possible eosinophilic esophagitis. Done at 20 and       30cm.      The bulb and 2nd part of the duodenum was normal. Impression:        - Reflux esophagitis.                    - Normal stomach.                  - No specimens collected. Recommendation:    - soft food for 3 days, eat slowly, chew well, take small  bites. Take Omeprazole  a day for 2 monhs. Call office                     tomorrow at [phone] to make follow up appointment to                     see me or my nurse practioner Scot Jun, MD 11/30/2015 7:30:31 PM This report has been signed electronically. Number of Addenda: 0 Note Initiated On: 11/30/2015 6:42 PM      [facility]

## 2016-03-18 ENCOUNTER — Encounter: Payer: Self-pay | Admitting: *Deleted

## 2016-03-19 ENCOUNTER — Ambulatory Visit: Payer: 59 | Admitting: Anesthesiology

## 2016-03-19 ENCOUNTER — Ambulatory Visit
Admission: RE | Admit: 2016-03-19 | Discharge: 2016-03-19 | Disposition: A | Discharge status: Home / Self Care | Payer: 59 | Source: Ambulatory Visit | Attending: Unknown Physician Specialty | Admitting: Unknown Physician Specialty

## 2016-03-19 ENCOUNTER — Encounter
Admission: RE | Disposition: A | Discharge status: Home / Self Care | Payer: Self-pay | Source: Ambulatory Visit | Attending: Unknown Physician Specialty

## 2016-03-19 ENCOUNTER — Encounter: Payer: Self-pay | Admitting: *Deleted

## 2016-03-19 DIAGNOSIS — Z87891 Personal history of nicotine dependence: Secondary | ICD-10-CM | POA: Diagnosis not present

## 2016-03-19 DIAGNOSIS — K200 Eosinophilic esophagitis: Secondary | ICD-10-CM | POA: Insufficient documentation

## 2016-03-19 HISTORY — DX: Esophageal obstruction: K22.2

## 2016-03-19 HISTORY — DX: Failed or difficult intubation, initial encounter: T88.4XXA

## 2016-03-19 HISTORY — PX: ESOPHAGOGASTRODUODENOSCOPY (EGD) WITH PROPOFOL: SHX5813

## 2016-03-19 HISTORY — DX: Adverse effect of unspecified anesthetic, initial encounter: T41.45XA

## 2016-03-19 HISTORY — DX: Other complications of anesthesia, initial encounter: T88.59XA

## 2016-03-19 HISTORY — PX: SAVORY DILATION: SHX5439

## 2016-03-19 SURGERY — ESOPHAGOGASTRODUODENOSCOPY (EGD) WITH PROPOFOL
Anesthesia: General

## 2016-03-19 MED ORDER — FENTANYL CITRATE (PF) 100 MCG/2ML IJ SOLN
INTRAMUSCULAR | Status: DC | PRN
  Administered 2016-03-19: 50 ug via INTRAVENOUS

## 2016-03-19 MED ORDER — LIDOCAINE HCL (PF) 2 % IJ SOLN
INTRAMUSCULAR | Status: DC | PRN
  Administered 2016-03-19: 60 mg

## 2016-03-19 MED ORDER — GLYCOPYRROLATE 0.2 MG/ML IJ SOLN
INTRAMUSCULAR | Status: DC | PRN
  Administered 2016-03-19: 0.2 mg via INTRAVENOUS

## 2016-03-19 MED ORDER — PROPOFOL 500 MG/50ML IV EMUL
INTRAVENOUS | Status: DC | PRN
  Administered 2016-03-19: 100 ug/kg/min via INTRAVENOUS

## 2016-03-19 MED ORDER — PROPOFOL 10 MG/ML IV BOLUS
INTRAVENOUS | Status: DC | PRN
  Administered 2016-03-19: 40 mg via INTRAVENOUS

## 2016-03-19 MED ORDER — MIDAZOLAM HCL 5 MG/5ML IJ SOLN
INTRAMUSCULAR | Status: DC | PRN
  Administered 2016-03-19: 2 mg via INTRAVENOUS

## 2016-03-19 MED ORDER — SODIUM CHLORIDE 0.9 % IV SOLN
INTRAVENOUS | Status: DC
  Administered 2016-03-19: 1000 mL via INTRAVENOUS

## 2016-03-19 NOTE — Op Note (Signed)
[facility] Gastroenterology Patient Name: Rodney SimmerFrederick Thomure Procedure Date: 03/19/2016 9:06 AM MRN: [id number] Account #: [id number] Date of Birth: [birth year] Admit Type: Outpatient Age: 45 [facility] 1 Gender: Male Note Status: Finalized Procedure:            Upper GI endoscopy Indications:          Follow-up of eosinophilic esophagitis Providers:            Scot Junobert T. Elliott, MD Referring MD:         Atilano Medianatherine Cone, MD (Referring MD) Medicines:            Propofol per Anesthesia Complications:        No immediate complications. Procedure:            Pre-Anesthesia Assessment:                       - After reviewing the risks and benefits, the patient                        was deemed in satisfactory condition to undergo the                        procedure.                       After obtaining informed consent, the endoscope was                        passed under direct vision. Throughout the procedure,                        the patient's blood pressure, pulse, and oxygen                        saturations were monitored continuously. The Endoscope                        was introduced through the mouth, and advanced to the                        second part of duodenum. The upper GI endoscopy was                        accomplished without difficulty. The patient tolerated                        the procedure well. Findings:      Mucosal changes including ringed esophagus were found in the middle       third of the esophagus and in the lower third of the esophagus. These       were barely noticed and were very subtle. To check for microscopic       improvement Biopsies were obtained from the proximal and distal       esophagus with cold forceps for histology of suspected eosinophilic       esophagitis.      The stomach was normal.      The examined duodenum was normal. Impression:           - Esophageal mucosal changes consistent with              eosinophilic  esophagitis. Biopsied.                       - Normal stomach.                       - Normal examined duodenum. Recommendation:       - Await pathology results. Scot Jun, MD 03/19/2016 9:24:52 AM This report has been signed electronically. Number of Addenda: 0 Note Initiated On: 03/19/2016 9:06 AM      [facility]

## 2016-03-19 NOTE — Anesthesia Postprocedure Evaluation (Signed)
Anesthesia Post Note  Patient: Rodney Salinas  Procedure(s) Performed: Procedure(s) (LRB): ESOPHAGOGASTRODUODENOSCOPY (EGD) WITH PROPOFOL (N/A) SAVORY DILATION (N/A)  Patient location during evaluation: Endoscopy Anesthesia Type: General Level of consciousness: awake and alert Pain management: pain level controlled Vital Signs Assessment: post-procedure vital signs reviewed and stable Respiratory status: spontaneous breathing, nonlabored ventilation, respiratory function stable and patient connected to nasal cannula oxygen Cardiovascular status: blood pressure returned to baseline and stable Postop Assessment: no signs of nausea or vomiting Anesthetic complications: no    Last Vitals:  Filed Vitals:   03/19/16 0956 03/19/16 1006  BP: 105/75 112/78  Pulse: 64 59  Temp:    Resp: 15 17    Last Pain:  Filed Vitals:   03/19/16 1007  PainSc: Asleep                 Cleda MccreedyJoseph K Kinser Fellman

## 2016-03-19 NOTE — Anesthesia Preprocedure Evaluation (Signed)
Anesthesia Evaluation  Patient identified by MRN, date of birth, ID band Patient awake    Reviewed: Allergy & Precautions, H&P , NPO status , Patient's Chart, lab work & pertinent test results  History of Anesthesia Complications (+) DIFFICULT AIRWAY and history of anesthetic complications  Airway Mallampati: II  TM Distance: <3 FB Neck ROM: full    Dental  (+) Poor Dentition, Chipped   Pulmonary neg shortness of breath, former smoker,    Pulmonary exam normal breath sounds clear to auscultation       Cardiovascular Exercise Tolerance: Good (-) angina(-) Past MI and (-) DOE negative cardio ROS Normal cardiovascular exam Rhythm:regular Rate:Normal     Neuro/Psych negative neurological ROS  negative psych ROS   GI/Hepatic negative GI ROS, Neg liver ROS, neg GERD  ,  Endo/Other  negative endocrine ROS  Renal/GU negative Renal ROS  negative genitourinary   Musculoskeletal   Abdominal   Peds  Hematology negative hematology ROS (+)   Anesthesia Other Findings Past Medical History:   History of kidney stones                                     Cutis verticis gyrata                                        Esophageal stricture                                         Complication of anesthesia                                   Difficult intubation                                        Past Surgical History:   WISDOM TOOTH EXTRACTION                                       FOREIGN BODY REMOVAL                            N/A 11/30/2015      Comment:Procedure: FOREIGN BODY REMOVAL;  Surgeon:               Scot Junobert T Elliott, MD;  [facility];  Service: Endoscopy;  Laterality:               N/A;  BMI    Body Mass Index   25.09 kg/m 2      Reproductive/Obstetrics negative OB ROS                             Anesthesia Physical Anesthesia Plan  ASA: II  Anesthesia  Plan: General   Post-op Pain Management:    Induction:   Airway Management Planned:  Additional Equipment:   Intra-op Plan:   Post-operative Plan:   Informed Consent: I have reviewed the patients History and Physical, chart, labs and discussed the procedure including the risks, benefits and alternatives for the proposed anesthesia with the patient or authorized representative who has indicated his/her understanding and acceptance.   Dental Advisory Given  Plan Discussed with: Anesthesiologist, CRNA and Surgeon  Anesthesia Plan Comments:         Anesthesia Quick Evaluation

## 2016-03-19 NOTE — Transfer of Care (Signed)
Immediate Anesthesia Transfer of Care Note  Patient: Rodney Salinas  Procedure(s) Performed: Procedure(s): ESOPHAGOGASTRODUODENOSCOPY (EGD) WITH PROPOFOL (N/A) SAVORY DILATION (N/A)  Patient Location: PACU  Anesthesia Type:General  Level of Consciousness: sedated  Airway & Oxygen Therapy: Patient Spontanous Breathing and Patient connected to nasal cannula oxygen  Post-op Assessment: Report given to RN and Post -op Vital signs reviewed and stable  Post vital signs: Reviewed and stable  Last Vitals:  Filed Vitals:   03/19/16 0824  BP: 114/82  Pulse: 67  Temp: 36.1 C  Resp: 16    Last Pain: There were no vitals filed for this visit.       Complications: No apparent anesthesia complications

## 2016-03-21 ENCOUNTER — Encounter: Payer: Self-pay | Admitting: Unknown Physician Specialty

## 2016-03-22 LAB — SURGICAL PATHOLOGY

## 2016-04-06 NOTE — H&P (Signed)
Primary Care Physician:  [facility] [place] Primary Gastroenterologist:  Dr. Mechele CollinElliott dys Pre-Procedure History & Physical: HPI:  Rodney SimmerFrederick Briddell is a 45 y.o. male is here for an endoscopy.   Past Medical History  Diagnosis Date  . History of kidney stones   . Cutis verticis gyrata   . Esophageal stricture   . Complication of anesthesia   . Difficult intubation     Past Surgical History  Procedure Laterality Date  . Wisdom tooth extraction    . Foreign body removal N/A 11/30/2015    Procedure: FOREIGN BODY REMOVAL;  Surgeon: Scot Junobert T Michaeleen Down, MD;  [facility];  Service: Endoscopy;  Laterality: N/A;  . Esophagogastroduodenoscopy (egd) with propofol N/A 03/19/2016    Procedure: ESOPHAGOGASTRODUODENOSCOPY (EGD) WITH PROPOFOL;  Surgeon: Scot Junobert T Candice Tobey, MD;  [facility];  Service: Endoscopy;  Laterality: N/A;  . Savory dilation N/A 03/19/2016    Procedure: SAVORY DILATION;  Surgeon: Scot Junobert T Lonie Newsham, MD;  [facility];  Service: Endoscopy;  Laterality: N/A;    Prior to Admission medications   Medication Sig Start Date End Date Taking? Authorizing Provider  Fluticasone Propionate, Inhal, (FLOVENT IN) Inhale into the lungs 2 (two) times daily.   Yes Historical Provider, MD  OMEPRAZOLE PO Take by mouth.   Yes Historical Provider, MD  Soft Lens Products (OPTI-FREE FOR DISINFECTION) 0.1 % SOLN 1 drop by Does not apply route daily as needed (for contacts.).    Historical Provider, MD    Allergies as of 12/30/2015  . (No Known Allergies)    Family History  Problem Relation Age of Onset  . Hypertension Mother   . Arthritis Mother     OA  . Migraines Mother   . Cancer Father     Prostate  . Hyperlipidemia Father     Social History   Social History  . Marital Status: Single    Spouse Name: N/A  . Number of Children: 1  . Years of Education: N/A   Occupational History  . customer service coordinator    Social History Main  Topics  . Smoking status: Former Games developermoker  . Smokeless tobacco: Not on file     Comment: stopped smoking cigars about 2006-2007  . Alcohol Use: 0.0 oz/week    0 Standard drinks or equivalent per week     Comment: social  . Drug Use: Yes     Comment: marijuana  . Sexual Activity:    Partners: Female    Birth Control/ Protection: Other-see comments     Comment: sometimes   Other Topics Concern  . Not on file   Social History Narrative    Review of Systems: See HPI, otherwise negative ROS  Physical Exam: BP 112/78 mmHg  Pulse 59  Temp(Src) 97.5 F (36.4 C) (Tympanic)  Resp 17  Ht 5\' 8"  (1.727 m)  Wt 74.844 kg (165 lb)  BMI 25.09 kg/m2  SpO2 100% General:   Alert,  pleasant and cooperative in NAD Head:  Normocephalic and atraumatic. Neck:  Supple; no masses or thyromegaly. Lungs:  Clear throughout to auscultation.    Heart:  Regular rate and rhythm. Abdomen:  Soft, nontender and nondistended. Normal bowel sounds, without guarding, and without rebound.   Neurologic:  Alert and  oriented x4;  grossly normal neurologically.  Impression/Plan: Rodney Salinas is here for an endoscopy to be performed for dysphagia   Risks, benefits, limitations, and alternatives regarding  endoscopy have been reviewed with the patient.  Questions have  been answered.  All parties agreeable.   Lynnae Prude, MD  04/06/2016, 4:04 PM

## 2016-05-07 IMAGING — CR DG CHEST 1V PORT
1 series · 1 of 1 positions shown · non-contrast
Comparison: None.

CLINICAL DATA: Globus sensation in the upper chest after eating
steak.

EXAM:
PORTABLE CHEST 1 VIEW

[ap]
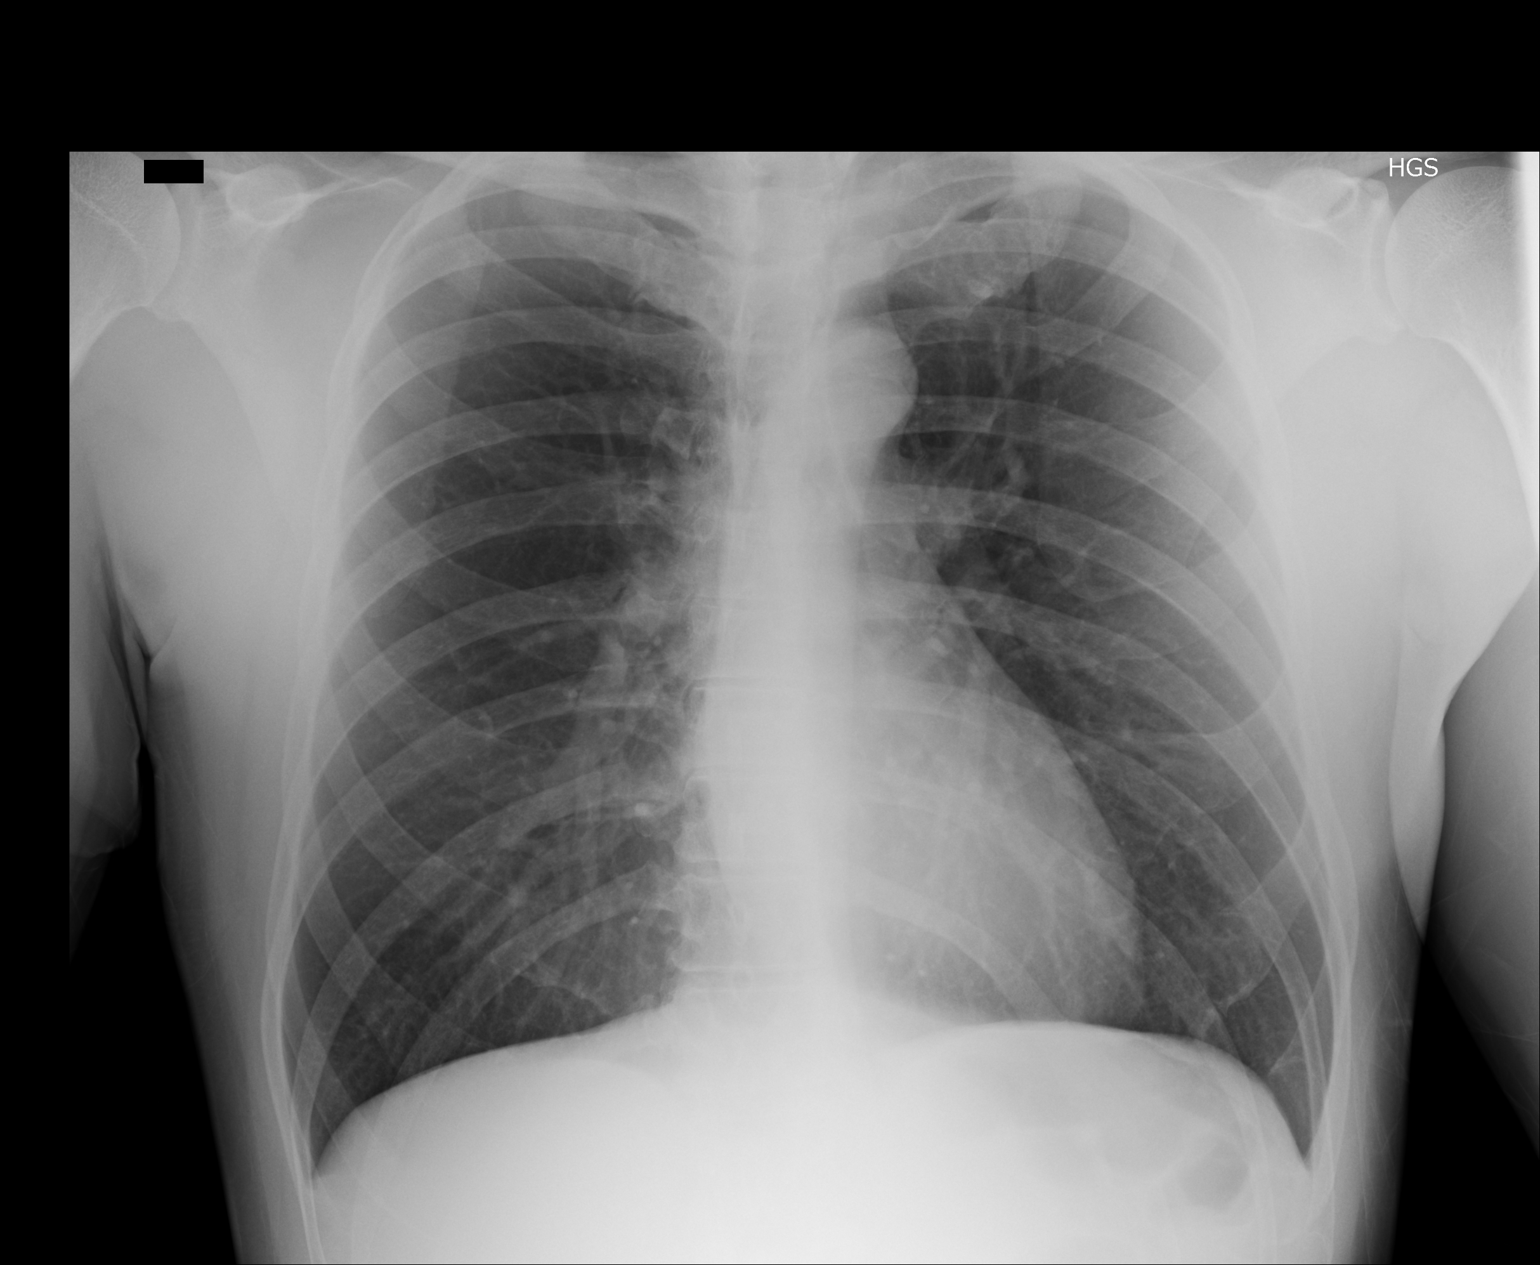

[1 of 1 positions shown; findings below may reference images not displayed]

FINDINGS: Normal heart size. Normal mediastinal contour, with no appreciable
pneumomediastinum. No pneumothorax. No pleural effusion. Clear
lungs, with no focal lung consolidation and no pulmonary edema. No
radiopaque foreign body.
IMPRESSION: No active disease in the chest. No appreciable pneumomediastinum. No
radiopaque foreign body.

## 2016-06-14 ENCOUNTER — Encounter: Payer: Self-pay | Admitting: Family Medicine

## 2016-06-14 ENCOUNTER — Ambulatory Visit (INDEPENDENT_AMBULATORY_CARE_PROVIDER_SITE_OTHER): Payer: 59 | Admitting: Family Medicine

## 2016-06-14 DIAGNOSIS — K200 Eosinophilic esophagitis: Secondary | ICD-10-CM

## 2016-06-14 DIAGNOSIS — R3912 Poor urinary stream: Secondary | ICD-10-CM | POA: Insufficient documentation

## 2016-06-14 DIAGNOSIS — R7989 Other specified abnormal findings of blood chemistry: Secondary | ICD-10-CM | POA: Diagnosis not present

## 2016-06-14 DIAGNOSIS — R17 Unspecified jaundice: Secondary | ICD-10-CM

## 2016-06-14 DIAGNOSIS — Z1211 Encounter for screening for malignant neoplasm of colon: Secondary | ICD-10-CM | POA: Diagnosis not present

## 2016-06-14 DIAGNOSIS — Z125 Encounter for screening for malignant neoplasm of prostate: Secondary | ICD-10-CM

## 2016-06-14 DIAGNOSIS — Z0000 Encounter for general adult medical examination without abnormal findings: Secondary | ICD-10-CM | POA: Diagnosis not present

## 2016-06-14 LAB — TSH: TSH: 0.93 m[IU]/L (ref 0.40–4.50)

## 2016-06-14 NOTE — Patient Instructions (Addendum)
Consider saw palmetto for prostate health We'll get labs today If you have not heard anything from my staff in a week about any orders/referrals/studies from today, please contact us here to follow-up [phone]  Health Maintenance, Male A healthy lifestyle and preventative care can promote health and wellness.  Maintain regular health, dental, and eye exams.  Eat a healthy diet. Foods like vegetables, fruits, whole grains, low-fat dairy products, and lean protein foods contain the nutrients you need and are low in calories. Decrease your intake of foods high in solid fats, added sugars, and salt. Get information about a proper diet from your health care provider, if necessary.  Regular physical exercise is one of the most important things you can do for your health. Most adults should get at least 150 minutes of moderate-intensity exercise (any activity that increases your heart rate and causes you to sweat) each week. In addition, most adults need muscle-strengthening exercises on 2 or more days a week.   Maintain a healthy weight. The body mass index (BMI) is a screening tool to identify possible weight problems. It provides an estimate of body fat based on height and weight. Your health care provider can find your BMI and can help you achieve or maintain a healthy weight. For males 20 years and older:  A BMI below 18.5 is considered underweight.  A BMI of 18.5 to 24.9 is normal.  A BMI of 25 to 29.9 is considered overweight.  A BMI of 30 and above is considered obese.  Maintain normal blood lipids and cholesterol by exercising and minimizing your intake of saturated fat. Eat a balanced diet with plenty of fruits and vegetables. Blood tests for lipids and cholesterol should begin at age 28 and be repeated every 5 years. If your lipid or cholesterol levels are high, you are over age 59, or you are at high risk for heart disease, you may need your cholesterol levels checked more  frequently.Ongoing high lipid and cholesterol levels should be treated with medicines if diet and exercise are not working.  If you smoke, find out from your health care provider how to quit. If you do not use tobacco, do not start.  Lung cancer screening is recommended for adults aged 55-80 years who are at high risk for developing lung cancer because of a history of smoking. A yearly low-dose CT scan of the lungs is recommended for people who have at least a 30-pack-year history of smoking and are current smokers or have quit within the past 15 years. A pack year of smoking is smoking an average of 1 pack of cigarettes a day for 1 year (for example, a 30-pack-year history of smoking could mean smoking 1 pack a day for 30 years or 2 packs a day for 15 years). Yearly screening should continue until the smoker has stopped smoking for at least 15 years. Yearly screening should be stopped for people who develop a health problem that would prevent them from having lung cancer treatment.  If you choose to drink alcohol, do not have more than 2 drinks per day. One drink is considered to be 12 oz (360 mL) of beer, 5 oz (150 mL) of wine, or 1.5 oz (45 mL) of liquor.  Avoid the use of street drugs. Do not share needles with anyone. Ask for help if you need support or instructions about stopping the use of drugs.  High blood pressure causes heart disease and increases the risk of stroke. High blood pressure  is more likely to develop in:  People who have blood pressure in the end of the normal range (100-139/85-89 mm Hg).  People who are overweight or obese.  People who are African American.  If you are 59-91 years of age, have your blood pressure checked every 3-5 years. If you are 88 years of age or older, have your blood pressure checked every year. You should have your blood pressure measured twice--once when you are at a hospital or clinic, and once when you are not at a hospital or clinic. Record the  average of the two measurements. To check your blood pressure when you are not at a hospital or clinic, you can use:  An automated blood pressure machine at a pharmacy.  A home blood pressure monitor.  If you are 31-1 years old, ask your health care provider if you should take aspirin to prevent heart disease.  Diabetes screening involves taking a blood sample to check your fasting blood sugar level. This should be done once every 3 years after age 44 if you are at a normal weight and without risk factors for diabetes. Testing should be considered at a younger age or be carried out more frequently if you are overweight and have at least 1 risk factor for diabetes.  Colorectal cancer can be detected and often prevented. Most routine colorectal cancer screening begins at the age of 52 and continues through age 43. However, your health care provider may recommend screening at an earlier age if you have risk factors for colon cancer. On a yearly basis, your health care provider may provide home test kits to check for hidden blood in the stool. A small camera at the end of a tube may be used to directly examine the colon (sigmoidoscopy or colonoscopy) to detect the earliest forms of colorectal cancer. Talk to your health care provider about this at age 39 when routine screening begins. A direct exam of the colon should be repeated every 5-10 years through age 74, unless early forms of precancerous polyps or small growths are found.  People who are at an increased risk for hepatitis B should be screened for this virus. You are considered at high risk for hepatitis B if:  You were born in a country where hepatitis B occurs often. Talk with your health care provider about which countries are considered high risk.  Your parents were born in a high-risk country and you have not received a shot to protect against hepatitis B (hepatitis B vaccine).  You have HIV or AIDS.  You use needles to inject street  drugs.  You live with, or have sex with, someone who has hepatitis B.  You are a man who has sex with other men (MSM).  You get hemodialysis treatment.  You take certain medicines for conditions like cancer, organ transplantation, and autoimmune conditions.  Hepatitis C blood testing is recommended for all people born from [birth year] through 1966 and any individual with known risk factors for hepatitis C.  Healthy men should no longer receive prostate-specific antigen (PSA) blood tests as part of routine cancer screening. Talk to your health care provider about prostate cancer screening.  Testicular cancer screening is not recommended for adolescents or adult males who have no symptoms. Screening includes self-exam, a health care provider exam, and other screening tests. Consult with your health care provider about any symptoms you have or any concerns you have about testicular cancer.  Practice safe sex. Use condoms and avoid high-risk  sexual practices to reduce the spread of sexually transmitted infections (STIs).  You should be screened for STIs, including gonorrhea and chlamydia if:  You are sexually active and are younger than 24 years.  You are older than 24 years, and your health care provider tells you that you are at risk for this type of infection.  Your sexual activity has changed since you were last screened, and you are at an increased risk for chlamydia or gonorrhea. Ask your health care provider if you are at risk.  If you are at risk of being infected with HIV, it is recommended that you take a prescription medicine daily to prevent HIV infection. This is called pre-exposure prophylaxis (PrEP). You are considered at risk if:  You are a man who has sex with other men (MSM).  You are a heterosexual man who is sexually active with multiple partners.  You take drugs by injection.  You are sexually active with a partner who has HIV.  Talk with your health care provider about  whether you are at high risk of being infected with HIV. If you choose to begin PrEP, you should first be tested for HIV. You should then be tested every 3 months for as long as you are taking PrEP.  Use sunscreen. Apply sunscreen liberally and repeatedly throughout the day. You should seek shade when your shadow is shorter than you. Protect yourself by wearing long sleeves, pants, a wide-brimmed hat, and sunglasses year round whenever you are outdoors.  Tell your health care provider of new moles or changes in moles, especially if there is a change in shape or color. Also, tell your health care provider if a mole is larger than the size of a pencil eraser.  A one-time screening for abdominal aortic aneurysm (AAA) and surgical repair of large AAAs by ultrasound is recommended for men aged 65-75 years who are current or former smokers.  Stay current with your vaccines (immunizations).   This information is not intended to replace advice given to you by your health care provider. Make sure you discuss any questions you have with your health care provider.   Document Released: 04/29/2008 Document Revised: 11/22/2014 Document Reviewed: 03/29/2011 Elsevier Interactive Patient Education [employer]

## 2016-06-14 NOTE — Assessment & Plan Note (Signed)
Prostate cancer screening controversy discussed; pt opted for PSA only

## 2016-06-14 NOTE — Assessment & Plan Note (Signed)
Discussed; pt opted for cologuard

## 2016-06-14 NOTE — Assessment & Plan Note (Signed)
Discussed DRE, PSA, urology referral

## 2016-06-14 NOTE — Assessment & Plan Note (Signed)
Discussed with patient; seen by GI

## 2016-06-14 NOTE — Assessment & Plan Note (Signed)
USPSTF grade A and B recommendations reviewed with patient; age-appropriate recommendations, preventive care, screening tests, etc discussed and encouraged; healthy living encouraged; see AVS for patient education given to patient  

## 2016-06-14 NOTE — Progress Notes (Signed)
Patient ID: Rodney Salinas, male   DOB: Jul 12, 1971, 45 y.o.   MRN: [id number]   Subjective:   Rodney Salinas is a 45 y.o. male here for a complete physical exam  Interim issues since last visit: he was tested for allergies and they said no known allergies; this past weekend, had some runny nose and sneezing and nose tingling; maybe something not tested for; someone off [facility], referred by Dr. Earnest Conroy office; they are pretty much resolved now   USPSTF grade A and B recommendations Alcohol: rare Depression: Depression screen [facility] 2/9 06/14/2016 06/09/2015  Decreased Interest 0 1  Down, Depressed, Hopeless 0 0  PHQ - 2 Score 0 1    Hypertension: well-controlled Obesity: weight fluctuates; 155-175 pounds Tobacco use: other HIV, hep B, hep C: hep politely declined, hiv consented STD testing and prevention (chl/gon/syphilis): declined Lipids: check, fasting since breakfast Glucose: check Colorectal cancer: father had prostate, not colon, cancer; pt has not had a colonoscopy  Breast cancer: no lump Lung cancer: n/a Osteoporosis: n/a AAA: n/a Aspirin: fam hx of heart attack on maternal side; discussed aspirin and we'll hold for now Diet: better than average Exercise: used to exercise, "I've been bad this year" Skin cancer: no worrisome moles  Past Medical History:  Diagnosis Date  . Complication of anesthesia   . Cutis verticis gyrata   . Difficult intubation   . Esophageal stricture   . History of kidney stones   MD note: scalp has wrinkles and ridges  Past Surgical History:  Procedure Laterality Date  . ESOPHAGOGASTRODUODENOSCOPY (EGD) WITH PROPOFOL N/A 03/19/2016   Procedure: ESOPHAGOGASTRODUODENOSCOPY (EGD) WITH PROPOFOL;  Surgeon: Scot Jun, MD;  Location: [facility] ENDOSCOPY;  Service: Endoscopy;  Laterality: N/A;  . FOREIGN BODY REMOVAL N/A 11/30/2015   Procedure: FOREIGN BODY REMOVAL;  Surgeon: Scot Jun, MD;  Location: [facility] ENDOSCOPY;  Service: Endoscopy;   Laterality: N/A;  . SAVORY DILATION N/A 03/19/2016   Procedure: SAVORY DILATION;  Surgeon: Scot Jun, MD;  Location: [facility] ENDOSCOPY;  Service: Endoscopy;  Laterality: N/A;  . WISDOM TOOTH EXTRACTION     Family History  Problem Relation Age of Onset  . Hypertension Mother   . Arthritis Mother     OA  . Migraines Mother   . Cancer Father     Prostate  . Hyperlipidemia Father    Social History  Substance Use Topics  . Smoking status: Former Games developer  . Smokeless tobacco: Never Used     Comment: stopped smoking cigars about 2006-2007  . Alcohol use 0.0 oz/week     Comment: social   Review of Systems  Objective:   Vitals:   06/14/16 1415  BP: 116/78  Pulse: 75  Resp: 14  Temp: 98.5 F (36.9 C)  TempSrc: Oral  SpO2: 98%  Weight: 164 lb (74.4 kg)   Body mass index is 24.94 kg/m. Wt Readings from Last 3 Encounters:  06/14/16 164 lb (74.4 kg)  03/19/16 165 lb (74.8 kg)  11/30/15 150 lb (68 kg)   Physical Exam  Constitutional: He appears well-developed and well-nourished. No distress.  HENT:  Head: Normocephalic and atraumatic.  Nose: Nose normal.  Mouth/Throat: Oropharynx is clear and moist.  Eyes: EOM are normal. No scleral icterus.  Neck: No JVD present. No thyromegaly present.  Cardiovascular: Normal rate, regular rhythm and normal heart sounds.   Pulmonary/Chest: Effort normal and breath sounds normal. No respiratory distress. He has no wheezes. He has no rales.  Abdominal: Soft. Bowel sounds  are normal. He exhibits no distension. There is no tenderness. There is no guarding.  Musculoskeletal: Normal range of motion. He exhibits no edema.  Lymphadenopathy:    He has no cervical adenopathy.  Neurological: He is alert. He displays normal reflexes. He exhibits normal muscle tone. Coordination normal.  Skin: Skin is warm and dry. No rash noted. He is not diaphoretic. No erythema. No pallor.  Psychiatric: He has a normal mood and affect. His behavior is normal.  Judgment and thought content normal.    Assessment/Plan:   Problem List Items Addressed This Visit      Digestive   Esophagitis, eosinophilic    Discussed with patient; seen by GI        Other   Weak urine stream    Discussed DRE, PSA, urology referral      Prostate cancer screening    Prostate cancer screening controversy discussed; pt opted for PSA only      Relevant Orders   PSA (Completed)   Preventative health care    USPSTF grade A and B recommendations reviewed with patient; age-appropriate recommendations, preventive care, screening tests, etc discussed and encouraged; healthy living encouraged; see AVS for patient education given to patient      Relevant Orders   Lipid panel (Completed)   Comprehensive metabolic panel (Completed)   TSH (Completed)   Elevated bilirubin    May be Gilbert's, but recheck      Colon cancer screening    Discussed; pt opted for cologuard      Relevant Orders   Cologuard   Abnormal thyroid stimulating hormone (TSH) level    Recheck TSH today       Other Visit Diagnoses   None.     No orders of the defined types were placed in this encounter.  Orders Placed This Encounter  Procedures  . PSA  . Lipid panel  . Comprehensive metabolic panel  . TSH  . Cologuard   Follow up plan: Return in about 1 year (around 06/14/2017) for  -- request records from allergist off [facility] from spring 2017, complete physical.  An After Visit Summary was printed and given to the patient.

## 2016-06-15 LAB — COMPREHENSIVE METABOLIC PANEL
ALBUMIN: 4.6 g/dL (ref 3.6–5.1)
ALT: 15 U/L (ref 9–46)
AST: 18 U/L (ref 10–40)
Alkaline Phosphatase: 76 U/L (ref 40–115)
BUN: 12 mg/dL (ref 7–25)
CHLORIDE: 105 mmol/L (ref 98–110)
CO2: 28 mmol/L (ref 20–31)
CREATININE: 1.03 mg/dL (ref 0.60–1.35)
Calcium: 9.5 mg/dL (ref 8.6–10.3)
Glucose, Bld: 101 mg/dL — ABNORMAL HIGH (ref 65–99)
POTASSIUM: 4.5 mmol/L (ref 3.5–5.3)
SODIUM: 143 mmol/L (ref 135–146)
Total Bilirubin: 0.8 mg/dL (ref 0.2–1.2)
Total Protein: 7.1 g/dL (ref 6.1–8.1)

## 2016-06-15 LAB — LIPID PANEL
Cholesterol: 159 mg/dL (ref 125–200)
HDL: 42 mg/dL
LDL Cholesterol: 102 mg/dL
Total CHOL/HDL Ratio: 3.8 ratio
Triglycerides: 73 mg/dL
VLDL: 15 mg/dL

## 2016-06-15 LAB — PSA: PSA: 0.55 ng/mL (ref <=4.00)

## 2016-06-19 NOTE — Assessment & Plan Note (Signed)
Recheck TSH today.  

## 2016-06-19 NOTE — Assessment & Plan Note (Signed)
May be Gilbert's, but recheck

## 2017-06-16 ENCOUNTER — Encounter: Payer: 59 | Admitting: Family Medicine
